# Patient Record
Sex: Male | Born: 2000 | Race: Black or African American | Hispanic: No | Marital: Single | Smoking: Current every day smoker
Health system: Southern US, Community
[De-identification: ages and names within clinical notes are randomized; demographics above are authoritative.]

## PROBLEM LIST (undated history)

## (undated) DIAGNOSIS — Z21 Asymptomatic human immunodeficiency virus [HIV] infection status: Secondary | ICD-10-CM

## (undated) DIAGNOSIS — B20 Human immunodeficiency virus [HIV] disease: Secondary | ICD-10-CM

---
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
## 2021-12-04 ENCOUNTER — Emergency Department (HOSPITAL_BASED_OUTPATIENT_CLINIC_OR_DEPARTMENT_OTHER): Payer: Medicaid Other

## 2021-12-04 ENCOUNTER — Emergency Department (HOSPITAL_BASED_OUTPATIENT_CLINIC_OR_DEPARTMENT_OTHER)
Admission: EM | Admit: 2021-12-04 | Discharge: 2021-12-04 | Disposition: A | Discharge status: Home / Self Care | Payer: Medicaid Other | Attending: Emergency Medicine | Admitting: Emergency Medicine

## 2021-12-04 ENCOUNTER — Encounter (HOSPITAL_BASED_OUTPATIENT_CLINIC_OR_DEPARTMENT_OTHER): Payer: Self-pay

## 2021-12-04 ENCOUNTER — Other Ambulatory Visit: Payer: Self-pay

## 2021-12-04 DIAGNOSIS — J3489 Other specified disorders of nose and nasal sinuses: Secondary | ICD-10-CM | POA: Insufficient documentation

## 2021-12-04 DIAGNOSIS — R0982 Postnasal drip: Secondary | ICD-10-CM | POA: Insufficient documentation

## 2021-12-04 DIAGNOSIS — R051 Acute cough: Secondary | ICD-10-CM | POA: Insufficient documentation

## 2021-12-04 DIAGNOSIS — Z21 Asymptomatic human immunodeficiency virus [HIV] infection status: Secondary | ICD-10-CM | POA: Insufficient documentation

## 2021-12-04 DIAGNOSIS — R109 Unspecified abdominal pain: Secondary | ICD-10-CM | POA: Diagnosis not present

## 2021-12-04 DIAGNOSIS — M791 Myalgia, unspecified site: Secondary | ICD-10-CM | POA: Diagnosis not present

## 2021-12-04 DIAGNOSIS — R197 Diarrhea, unspecified: Secondary | ICD-10-CM | POA: Diagnosis not present

## 2021-12-04 DIAGNOSIS — K5900 Constipation, unspecified: Secondary | ICD-10-CM | POA: Diagnosis not present

## 2021-12-04 DIAGNOSIS — Z20822 Contact with and (suspected) exposure to covid-19: Secondary | ICD-10-CM | POA: Diagnosis not present

## 2021-12-04 DIAGNOSIS — R112 Nausea with vomiting, unspecified: Secondary | ICD-10-CM | POA: Insufficient documentation

## 2021-12-04 HISTORY — DX: Asymptomatic human immunodeficiency virus (hiv) infection status: Z21

## 2021-12-04 HISTORY — DX: Human immunodeficiency virus (HIV) disease: B20

## 2021-12-04 LAB — CBC WITH DIFFERENTIAL/PLATELET
Abs Immature Granulocytes: 0.01 10*3/uL (ref 0.00–0.07)
Basophils Absolute: 0 10*3/uL (ref 0.0–0.1)
Basophils Relative: 0 %
Eosinophils Absolute: 0 10*3/uL (ref 0.0–0.5)
Eosinophils Relative: 1 %
HCT: 49.3 % (ref 39.0–52.0)
Hemoglobin: 17.2 g/dL — ABNORMAL HIGH (ref 13.0–17.0)
Immature Granulocytes: 0 %
Lymphocytes Relative: 27 %
Lymphs Abs: 1.4 10*3/uL (ref 0.7–4.0)
MCH: 33.1 pg (ref 26.0–34.0)
MCHC: 34.9 g/dL (ref 30.0–36.0)
MCV: 94.8 fL (ref 80.0–100.0)
Monocytes Absolute: 0.5 10*3/uL (ref 0.1–1.0)
Monocytes Relative: 9 %
Neutro Abs: 3.2 10*3/uL (ref 1.7–7.7)
Neutrophils Relative %: 63 %
Platelets: 268 10*3/uL (ref 150–400)
RBC: 5.2 MIL/uL (ref 4.22–5.81)
RDW: 12.8 % (ref 11.5–15.5)
WBC: 5.2 10*3/uL (ref 4.0–10.5)
nRBC: 0 % (ref 0.0–0.2)

## 2021-12-04 LAB — COMPREHENSIVE METABOLIC PANEL
ALT: 16 U/L (ref 0–44)
AST: 22 U/L (ref 15–41)
Albumin: 4.7 g/dL (ref 3.5–5.0)
Alkaline Phosphatase: 76 U/L (ref 38–126)
Anion gap: 10 (ref 5–15)
BUN: 10 mg/dL (ref 6–20)
CO2: 26 mmol/L (ref 22–32)
Calcium: 9.7 mg/dL (ref 8.9–10.3)
Chloride: 101 mmol/L (ref 98–111)
Creatinine, Ser: 1.07 mg/dL (ref 0.61–1.24)
GFR, Estimated: >60 mL/min (ref >60)
Glucose, Bld: 89 mg/dL (ref 70–99)
Potassium: 3.6 mmol/L (ref 3.5–5.1)
Sodium: 137 mmol/L (ref 135–145)
Total Bilirubin: 1.1 mg/dL (ref 0.3–1.2)
Total Protein: 8.9 g/dL — ABNORMAL HIGH (ref 6.5–8.1)

## 2021-12-04 LAB — RESP PANEL BY RT-PCR (FLU A&B, COVID) ARPGX2
Influenza A by PCR: NEGATIVE
Influenza B by PCR: NEGATIVE
SARS Coronavirus 2 by RT PCR: NEGATIVE

## 2021-12-04 LAB — LIPASE, BLOOD: Lipase: 32 U/L (ref 11–51)

## 2021-12-04 LAB — URINALYSIS, ROUTINE W REFLEX MICROSCOPIC
Bilirubin Urine: NEGATIVE
Glucose, UA: NEGATIVE mg/dL
Hgb urine dipstick: NEGATIVE
Ketones, ur: NEGATIVE mg/dL
Leukocytes,Ua: NEGATIVE
Nitrite: NEGATIVE
Protein, ur: NEGATIVE mg/dL
Specific Gravity, Urine: 1.025 (ref 1.005–1.030)
pH: 7 (ref 5.0–8.0)

## 2021-12-04 MED ORDER — ONDANSETRON HCL 4 MG PO TABS: 4.0000 mg | ORAL_TABLET | Freq: Four times a day (QID) | ORAL | 0 refills | Status: DC

## 2021-12-04 MED ORDER — DICYCLOMINE HCL 20 MG PO TABS: 20.0000 mg | ORAL_TABLET | Freq: Two times a day (BID) | ORAL | 0 refills | Status: DC

## 2021-12-04 MED ORDER — DICYCLOMINE HCL 10 MG/ML IM SOLN
20.0000 mg | Freq: Once | INTRAMUSCULAR | Status: AC
  Administered 2021-12-04: 20 mg via INTRAMUSCULAR
  Filled 2021-12-04: qty 2

## 2021-12-04 MED ORDER — IOHEXOL 300 MG/ML  SOLN
100.0000 mL | Freq: Once | INTRAMUSCULAR | Status: AC | PRN
  Administered 2021-12-04: 100 mL via INTRAVENOUS

## 2021-12-04 MED ORDER — ALBUTEROL SULFATE HFA 108 (90 BASE) MCG/ACT IN AERS
2.0000 | INHALATION_SPRAY | Freq: Once | RESPIRATORY_TRACT | Status: AC
  Administered 2021-12-04: 2 via RESPIRATORY_TRACT
  Filled 2021-12-04: qty 6.7

## 2021-12-04 MED ORDER — ONDANSETRON HCL 4 MG/2ML IJ SOLN
4.0000 mg | Freq: Once | INTRAMUSCULAR | Status: AC
  Administered 2021-12-04: 4 mg via INTRAVENOUS
  Filled 2021-12-04: qty 2

## 2021-12-04 MED ORDER — KETOROLAC TROMETHAMINE 15 MG/ML IJ SOLN
15.0000 mg | Freq: Once | INTRAMUSCULAR | Status: AC
  Administered 2021-12-04: 15 mg via INTRAVENOUS
  Filled 2021-12-04: qty 1

## 2021-12-04 MED ORDER — BENZONATATE 100 MG PO CAPS: 100.0000 mg | ORAL_CAPSULE | Freq: Three times a day (TID) | ORAL | 0 refills | Status: AC

## 2021-12-04 MED ORDER — SODIUM CHLORIDE 0.9 % IV BOLUS
1000.0000 mL | Freq: Once | INTRAVENOUS | Status: AC
  Administered 2021-12-04: 1000 mL via INTRAVENOUS

## 2021-12-04 NOTE — Discharge Instructions (Addendum)
Take zofran and bentyl for your abdominal symptoms.   Take tessalon for your cough  Please follow up with your primary doctor within the next 5-7 days.  If you do not have a primary care provider, information for a healthcare clinic has been provided for you to make arrangements for follow up care. Please return to the ER sooner if you have any new or worsening symptoms, or if you have any of the following symptoms:  Abdominal pain that does not go away.  You have a fever.  You keep throwing up (vomiting).  The pain is felt only in portions of the abdomen. Pain in the right side could possibly be appendicitis. In an adult, pain in the left lower portion of the abdomen could be colitis or diverticulitis.  You pass bloody or black tarry stools.  There is bright red blood in the stool.  The constipation stays for more than 4 days.  There is belly (abdominal) or rectal pain.  You do not seem to be getting better.  You have any questions or concerns.

## 2021-12-04 NOTE — ED Triage Notes (Signed)
Pt reports abdominal cramps, alternating constipation/diarrhea, decreased appetite.  Nausea, vomiting x1.  Denies fever.

## 2021-12-04 NOTE — ED Notes (Signed)
Ultrasound IV obtained after unsuccessful previous attempts. Pt tolerated procedure very well, calm and cooperative, 20g IV placed in Rt AC, CMS assessed post placement found to be WNL>

## 2021-12-04 NOTE — ED Provider Notes (Signed)
[facility] EMERGENCY DEPARTMENT Provider Note   CSN: [id number] Arrival date & time: 12/04/21  1127     History Chief Complaint  Patient presents with   Abdominal Pain    Adrian Little is a 20 y.o. male.  HPI   Pt is an 20 y/o male with a h/o HIV who presents to the ED today for eval of abd discomfort. He states he has had abd discomfort for the last few days. He had has had nausea and alternating diarrhea and constipation. He has also had vomiting. He further reports body aches, cough, rhinorrhea, post nasal drip.   Past Medical History:  Diagnosis Date   HIV (human immunodeficiency virus infection) (HCC)     There are no problems to display for this patient.   History reviewed. No pertinent surgical history.     History reviewed. No pertinent family history.     Home Medications Prior to Admission medications   Medication Sig Start Date End Date Taking? Authorizing Provider  benzonatate (TESSALON) 100 MG capsule Take 1 capsule (100 mg total) by mouth every 8 (eight) hours for 5 days. 12/04/21 12/09/21 Yes Naureen Benton S, PA-C  dicyclomine (BENTYL) 20 MG tablet Take 1 tablet (20 mg total) by mouth 2 (two) times daily. 12/04/21  Yes Buford Bremer S, PA-C  ondansetron (ZOFRAN) 4 MG tablet Take 1 tablet (4 mg total) by mouth every 6 (six) hours. 12/04/21  Yes Drake Wuertz S, PA-C    Allergies    Patient has no known allergies.  Review of Systems   Review of Systems  Constitutional:  Negative for fever.  HENT:  Positive for postnasal drip and rhinorrhea. Negative for ear pain and sore throat.   Eyes:  Negative for pain and visual disturbance.  Respiratory:  Positive for cough. Negative for shortness of breath.   Cardiovascular:  Negative for chest pain.  Gastrointestinal:  Positive for abdominal pain, constipation, diarrhea, nausea and vomiting.  Genitourinary:  Negative for dysuria and hematuria.  Musculoskeletal:  Positive for myalgias.  Negative for back pain.  Skin:  Negative for color change and rash.  Neurological:  Negative for seizures and syncope.  All other systems reviewed and are negative.  Physical Exam Updated Vital Signs BP 117/62   Pulse (!) 51   Temp 98.1 F (36.7 C) (Oral)   Resp 18   Ht 5\' 7"  (1.702 m)   Wt 81.6 kg   SpO2 100%   BMI 28.19 kg/m   Physical Exam Vitals and nursing note reviewed.  Constitutional:      General: He is not in acute distress.    Appearance: He is well-developed.  HENT:     Head: Normocephalic and atraumatic.  Eyes:     Conjunctiva/sclera: Conjunctivae normal.  Cardiovascular:     Rate and Rhythm: Normal rate and regular rhythm.     Heart sounds: No murmur heard. Pulmonary:     Effort: Pulmonary effort is normal. No respiratory distress.     Breath sounds: Normal breath sounds.  Abdominal:     Palpations: Abdomen is soft.     Tenderness: There is abdominal tenderness in the right lower quadrant, left upper quadrant and left lower quadrant. There is no guarding or rebound.  Musculoskeletal:        General: No swelling.     Cervical back: Neck supple.  Skin:    General: Skin is warm and dry.     Capillary Refill: Capillary refill takes less than 2 seconds.  Neurological:     Mental Status: He is alert.  Psychiatric:        Mood and Affect: Mood normal.    ED Results / Procedures / Treatments   Labs (all labs ordered are listed, but only abnormal results are displayed) Labs Reviewed  CBC WITH DIFFERENTIAL/PLATELET - Abnormal; Notable for the following components:      Result Value   Hemoglobin 17.2 (*)    All other components within normal limits  COMPREHENSIVE METABOLIC PANEL - Abnormal; Notable for the following components:   Total Protein 8.9 (*)    All other components within normal limits  RESP PANEL BY RT-PCR (FLU A&B, COVID) ARPGX2  LIPASE, BLOOD  URINALYSIS, ROUTINE W REFLEX MICROSCOPIC    EKG None  Radiology CT ABDOMEN PELVIS W  CONTRAST  Result Date: 12/04/2021 CLINICAL DATA:  RIGHT lower quadrant pain.  Abdominal cramps. EXAM: CT ABDOMEN AND PELVIS WITH CONTRAST TECHNIQUE: Multidetector CT imaging of the abdomen and pelvis was performed using the standard protocol following bolus administration of intravenous contrast. CONTRAST:  158mL OMNIPAQUE IOHEXOL 300 MG/ML  SOLN COMPARISON:  None. FINDINGS: Lower chest: Small focus of airspace density in the RIGHT middle lobe (image 10/3) Hepatobiliary: No focal hepatic lesion. No biliary duct dilatation. Common bile duct is normal. Pancreas: Pancreas is normal. No ductal dilatation. No pancreatic inflammation. Spleen: Normal spleen Adrenals/urinary tract: Adrenal glands and kidneys are normal. The ureters and bladder normal. Stomach/Bowel: Stomach, small bowel, appendix, and cecum are normal. The colon and rectosigmoid colon are normal. Vascular/Lymphatic: Abdominal aorta is normal caliber. No periportal or retroperitoneal adenopathy. No pelvic adenopathy. Reproductive: Unremarkable Other: No free fluid. Musculoskeletal: No aggressive osseous lesion. IMPRESSION: 1. Normal appendix. 2. No obstructive uropathy. 3. Normal gallbladder. Electronically Signed   By: Suzy Bouchard M.D.   On: 12/04/2021 15:46   DG Chest Portable 1 View  Result Date: 12/04/2021 CLINICAL DATA:  Cough EXAM: PORTABLE CHEST 1 VIEW COMPARISON:  None. FINDINGS: The heart size and mediastinal contours are within normal limits. Both lungs are clear. No pleural effusion. The visualized skeletal structures are unremarkable. IMPRESSION: No acute process in the chest. Electronically Signed   By: Macy Mis M.D.   On: 12/04/2021 13:44    Procedures Procedures   Medications Ordered in ED Medications  sodium chloride 0.9 % bolus 1,000 mL ( Intravenous Stopped 12/04/21 1558)  dicyclomine (BENTYL) injection 20 mg (20 mg Intramuscular Given 12/04/21 1448)  ondansetron (ZOFRAN) injection 4 mg (4 mg Intravenous Given  12/04/21 1357)  ketorolac (TORADOL) 15 MG/ML injection 15 mg (15 mg Intravenous Given 12/04/21 1539)  iohexol (OMNIPAQUE) 300 MG/ML solution 100 mL (100 mLs Intravenous Contrast Given 12/04/21 1527)    ED Course  I have reviewed the triage vital signs and the nursing notes.  Pertinent labs & imaging results that were available during my care of the patient were reviewed by me and considered in my medical decision making (see chart for details).    MDM Rules/Calculators/A&P                          20 year old male presents for evaluation of abdominal pain nausea vomiting diarrhea and URI symptoms.  He does have tenderness to the abdomen on exam.  His laboratory work is reassuring without leukocytosis, electrolyte abnormalities, abnormal kidney or liver function.  His COVID and flu test are negative.  His chest x-ray does not show pneumonia.  He CT abdomen/pelvis does not show any  emergent intra-abdominal/pelvic pathology at this time.  I suspect he likely has a viral illness.  He is unable to tolerate p.o. in the ED and feels some improvement after IV fluids, antiemetics, Bentyl and Toradol in the ED.  We will treat him supportively with Bentyl, Zofran and cough medication.  Have advised that he follow-up closely with PCP and return to the ED for any new or worsening symptoms.  He voices understanding of the plan and reasons to return.  All questions answered.  Patient stable for discharge.   Final Clinical Impression(s) / ED Diagnoses Final diagnoses:  Nausea vomiting and diarrhea  Acute cough    Rx / DC Orders ED Discharge Orders          Ordered    ondansetron (ZOFRAN) 4 MG tablet  Every 6 hours        12/04/21 1604    dicyclomine (BENTYL) 20 MG tablet  2 times daily        12/04/21 1604    benzonatate (TESSALON) 100 MG capsule  Every 8 hours        12/04/21 [address], Kariyah Baugh S, PA-C 12/04/21 1605    Gareth Morgan, MD 12/06/21 0006

## 2021-12-15 ENCOUNTER — Ambulatory Visit: Payer: Medicaid Other | Admitting: Pharmacist

## 2021-12-15 ENCOUNTER — Other Ambulatory Visit (HOSPITAL_COMMUNITY): Payer: Self-pay

## 2021-12-15 ENCOUNTER — Encounter: Payer: Self-pay | Admitting: Physician Assistant

## 2021-12-15 ENCOUNTER — Ambulatory Visit (INDEPENDENT_AMBULATORY_CARE_PROVIDER_SITE_OTHER): Payer: Medicaid Other | Admitting: Physician Assistant

## 2021-12-15 ENCOUNTER — Other Ambulatory Visit: Payer: Self-pay

## 2021-12-15 ENCOUNTER — Other Ambulatory Visit (HOSPITAL_COMMUNITY)
Admission: RE | Admit: 2021-12-15 | Discharge: 2021-12-15 | Disposition: A | Discharge status: Home / Self Care | Payer: Medicaid Other | Source: Ambulatory Visit | Attending: Physician Assistant | Admitting: Physician Assistant

## 2021-12-15 ENCOUNTER — Ambulatory Visit (INDEPENDENT_AMBULATORY_CARE_PROVIDER_SITE_OTHER): Payer: Medicaid Other

## 2021-12-15 VITALS — BP 119/76 | HR 60 | Temp 97.5°F | Wt 202.0 lb

## 2021-12-15 DIAGNOSIS — Z202 Contact with and (suspected) exposure to infections with a predominantly sexual mode of transmission: Secondary | ICD-10-CM | POA: Diagnosis not present

## 2021-12-15 DIAGNOSIS — Z23 Encounter for immunization: Secondary | ICD-10-CM | POA: Diagnosis present

## 2021-12-15 DIAGNOSIS — Z113 Encounter for screening for infections with a predominantly sexual mode of transmission: Secondary | ICD-10-CM

## 2021-12-15 DIAGNOSIS — B20 Human immunodeficiency virus [HIV] disease: Secondary | ICD-10-CM | POA: Diagnosis present

## 2021-12-15 MED ORDER — PENICILLIN G BENZATHINE 1200000 UNIT/2ML IM SUSY
1.2000 10*6.[IU] | PREFILLED_SYRINGE | Freq: Once | INTRAMUSCULAR | Status: AC
  Administered 2021-12-15: 11:00:00 1.2 10*6.[IU] via INTRAMUSCULAR

## 2021-12-15 MED ORDER — BIKTARVY 50-200-25 MG PO TABS: 1.0000 | ORAL_TABLET | Freq: Every day | ORAL | 5 refills | Status: DC

## 2021-12-15 MED ORDER — PENICILLIN G BENZATHINE 1200000 UNIT/2ML IM SUSY: 2.4000 10*6.[IU] | PREFILLED_SYRINGE | Freq: Once | INTRAMUSCULAR | Status: DC

## 2021-12-15 NOTE — Patient Instructions (Addendum)
Hello Adrian Little! Pleasure meeting you today.  Continue your Biktarvy daily.  We will set you up for a dental referral. We have provided bicillin today for your exposure to syphilis.  You will need to avoid sexual contact for 7-10 days. Make sure you either 1) don't have sex for a week or 2) use a condom with sex for at least a week. Condoms with sex is always recommended.  I will Mychart you the other lab results.  Today you received your final meningococcal vaccine, covid booster, flu vaccine, and bicillin 2.4 million units. I refilled Biktarvy and sent to pharmacy for 6 months Follow up in 3 months Continue to see your primary care provider for ongoing chest tightness, earlier if needed.

## 2021-12-15 NOTE — Addendum Note (Signed)
Addended by: Tressa Busman T on: 12/15/2021 10:46 AM   Modules accepted: Orders

## 2021-12-15 NOTE — Addendum Note (Signed)
Addended by: Tressa Busman T on: 12/15/2021 10:49 AM   Modules accepted: Orders

## 2021-12-15 NOTE — Progress Notes (Signed)
Initial Diagnosis: VL >2 million copies with nadir 292 CD4 diagnosed 04/2020 HLA [id number]: Negative Genotype: Could not access from records RPR: 1:64 08/2021 Lipid Profile:  Immunizations: Needs HPV, 2nd Meningococcal, flu, covid booster, pending Hep A antibody result  Prevnar -  Pneumovax -  Influenza -  Meningococcal -  Hepatitis B -     Subjective:    Patient ID: Adrian Little, male    DOB: 26-Oct-2001, 20 y.o.   MRN: [id number]  Chief Complaint  Patient presents with   New Patient (Initial Visit)    Transfer B20   Other    Transfer HIV patient from [facility] children's infectious Disease.      HPI:  Adrian Little is a 20 y.o. male AA, living with HIV-1 and is adherent to current ART regimen BIKTARVY. He has transferred his care from [facility]. He was diagnosed 5/202, most likely from sexual activity.  He is bisexual and currently in a monogamous relationship with a male partner who is also HIV-1 positive and seen in our clinic. His last viral load 09/14/21 9,544 copies and CD4 was 342, this was due to a recent move to [facility] when he was not adherent to his ART. He has not missed any doses since that time. He is living with partner in [place], [place].  He feels supported by his friends.  He has not disclosed HIV status to his younger sister or maternal grandmother he is closest with.  No relationship with father or 2 older brothers.  Mother is deceased. He is currently looking for work at [employer] in [place].  He does not have transportation and has been using [employer] or bus.  Originally from [place], [place] where he grew up in urban environment only.  He has no pets.  Has not been to dentist since young child.  He drinks twice weekly, does not binge does not enjoy being out of control.  Uses marijuana and no other illicit drugs. He hopes to attend college one day once he finds financial stability.  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 Partner currently being treated for syphilis, will need another round of bicillin. No other history of STI's.  He received covid series in Jan 2022 late 2021 per patient history. He would be ammenable to covid booster, flu vaccine, and 2nd Menvayo.     No Known Allergies    Outpatient Medications Prior to Visit  Medication Sig Dispense Refill   dicyclomine (BENTYL) 20 MG tablet Take 1 tablet (20 mg total) by mouth 2 (two) times daily. 20 tablet 0   ondansetron (ZOFRAN) 4 MG tablet Take 1 tablet (4 mg total) by mouth every 6 (six) hours. 12 tablet 0   bictegravir-emtricitabine-tenofovir AF (BIKTARVY) 50-200-25 MG TABS tablet Take 1 tablet by mouth daily.     No facility-administered medications prior to visit.     Past Medical History:  Diagnosis Date   HIV (human immunodeficiency virus infection) ([place])      History reviewed. No pertinent surgical history.     Review of Systems  Constitutional:  Negative for activity change, appetite change, chills, diaphoresis, fatigue and fever.  HENT:  Negative for mouth sores and sore throat.   Respiratory:  Positive for chest tightness (evaluated by primary care provider treated with mobic 11/2021). Negative for apnea, cough, shortness of breath and wheezing.   Cardiovascular:  Negative for chest pain, palpitations and leg swelling.  Gastrointestinal:  Negative for abdominal pain, anal bleeding, blood in stool, constipation, diarrhea, nausea, rectal pain and vomiting.  Genitourinary:  Negative for decreased urine volume, genital sores, hematuria, penile discharge, penile pain, penile swelling, scrotal swelling, testicular pain and urgency.  Musculoskeletal:  Negative for back pain and myalgias.  Skin:  Negative for rash and wound.  Neurological:  Negative for dizziness, tremors, seizures, weakness, light-headedness, numbness and headaches.  Hematological:  Negative for adenopathy.  Psychiatric/Behavioral:   Negative for agitation, behavioral problems, confusion, decreased concentration, dysphoric mood, hallucinations, self-injury, sleep disturbance and suicidal ideas. The patient is not nervous/anxious and is not hyperactive.      Objective:    There were no vitals taken for this visit. Nursing note and vital signs reviewed.  Physical Exam Vitals reviewed.  Constitutional:      General: He is not in acute distress.    Appearance: Normal appearance. He is normal weight. He is not ill-appearing.  HENT:     Head: Normocephalic and atraumatic.     Mouth/Throat:     Mouth: Mucous membranes are moist.     Pharynx: No oropharyngeal exudate or posterior oropharyngeal erythema.  Eyes:     Extraocular Movements: Extraocular movements intact.     Conjunctiva/sclera: Conjunctivae normal.     Pupils: Pupils are equal, round, and reactive to light.  Cardiovascular:     Rate and Rhythm: Normal rate and regular rhythm.  Pulmonary:     Effort: Pulmonary effort is normal.     Breath sounds: Normal breath sounds.  Musculoskeletal:        General: Normal range of motion.     Cervical back: Normal range of motion and neck supple. No tenderness.  Lymphadenopathy:     Cervical: No cervical adenopathy.  Skin:    General: Skin is warm and dry.     Findings: No lesion or rash.  Neurological:     General: No focal deficit present.     Mental Status: He is alert and oriented to person, place, and time.  Psychiatric:        Mood and Affect: Mood normal.        Behavior: Behavior normal.        Thought Content: Thought content normal.        Judgment: Judgment normal.     Depression screen PHQ 2/9 12/15/2021  Decreased Interest 0  Down, Depressed, Hopeless 0  PHQ - 2 Score 0       Assessment & Plan:  Reviewed medical records provided by [facility] children's infectious Disease Reviewed PCP CPE 12/08/2021 follow up scheduled 02/2022 Immunization-2nd meninogococcal vaccine, flu, covid booster-needs HPV  series STI screening-exposure to syphilis from partner-received Bicillin 2.4 million units today, received bicillin 2.4 million units x 3 this summer for a previous syphilis infection, screened for GC/chlamydia all sites today. Adivised to have no sexual contact for 10 days.  Dental referral today HIV-adherent to [facility] since last visit 08/2021-check VL, Cd4, no access to genotype, HLA B 5701, Hepatitis A ab. Refilled Biktarvy for 6 months will have $4 copay with Medicaid. Condoms provided along with lubricant.   Next visit needs HPV series, pneumococcal series, possible Hep A  Patient Active Problem List   Diagnosis Date Noted   HIV (human immunodeficiency virus infection) ([place]) 05/18/2020     Problem List Items Addressed This Visit       Other   HIV (human immunodeficiency virus infection) ([place]) - Primary   Relevant Medications   bictegravir-emtricitabine-tenofovir AF (  BIKTARVY) 50-200-25 MG TABS tablet   Other Relevant Orders   RPR   Other Visit Diagnoses     Venereal disease screening       Relevant Orders   HIV-1 RNA ultraquant reflex to gentyp+   T-helper cell (CD4)- ([facility] clinic only)   HLA B*5701   Hepatitis A antibody, total   Urine cytology ancillary only   Cytology (oral, anal, urethral) ancillary only   Cytology (oral, anal, urethral) ancillary only   Exposure to syphilis       Relevant Medications   penicillin g benzathine (BICILLIN LA) 1200000 UNIT/2ML injection 2.4 Million Units        I am having [name] maintain his ondansetron, dicyclomine, and Biktarvy. We will continue to administer penicillin g benzathine.   Meds ordered this encounter  Medications   bictegravir-emtricitabine-tenofovir AF (BIKTARVY) 50-200-25 MG TABS tablet    Sig: Take 1 tablet by mouth daily.    Dispense:  30 tablet    Refill:  5    Order Specific Question:   Supervising Provider    Answer:   VAN DAM, CORNELIUS N [[id number]]   penicillin g benzathine (BICILLIN LA)  1200000 UNIT/2ML injection 2.4 Million Units    Order Specific Question:   Antibiotic Indication:    Answer:   Syphilis     Follow-up: Return in about 3 months (around 03/15/2022) for  follow up.

## 2021-12-15 NOTE — Progress Notes (Signed)
° °  Covid-19 Vaccination Clinic  Name:  Adrian Little    MRN: [id number] DOB: [birth year]  12/15/2021  Adrian Little was observed post Covid-19 immunization for 15 minutes without incident. He was provided with Vaccine Information Sheet and instruction to access the V-Safe system.   Adrian Little was instructed to call 911 with any severe reactions post vaccine: Difficulty breathing  Swelling of face and throat  A fast heartbeat  A bad rash all over body  Dizziness and weakness   Immunizations Administered     Name Date Dose VIS Date Route   Pfizer Covid-19 Vaccine Bivalent Booster 12/15/2021 11:22 AM 0.3 mL 08/25/2021 Intramuscular   Manufacturer: [employer], [employer]   Lot: [id number]   NDC: [phone]      Hancel Ion T [name]

## 2021-12-16 LAB — CYTOLOGY, (ORAL, ANAL, URETHRAL) ANCILLARY ONLY
Chlamydia: NEGATIVE
Chlamydia: NEGATIVE
Comment: NEGATIVE
Comment: NEGATIVE
Comment: NORMAL
Comment: NORMAL
Neisseria Gonorrhea: NEGATIVE
Neisseria Gonorrhea: NEGATIVE

## 2021-12-16 LAB — URINE CYTOLOGY ANCILLARY ONLY
Chlamydia: NEGATIVE
Comment: NEGATIVE
Comment: NORMAL
Neisseria Gonorrhea: NEGATIVE

## 2021-12-17 LAB — T-HELPER CELL (CD4) - (RCID CLINIC ONLY)
CD4 % Helper T Cell: 34 % (ref 33–65)
CD4 T Cell Abs: 404 /uL (ref 400–1790)

## 2021-12-21 ENCOUNTER — Telehealth: Payer: Self-pay

## 2021-12-21 NOTE — Telephone Encounter (Signed)
Called patient to relay results, no answer and unable to leave voicemail.   Flordia Kassem D Chayse Gracey, RN  

## 2021-12-21 NOTE — Telephone Encounter (Signed)
-----   Message from [name], [place] sent at 12/21/2021 10:55 AM EST ----- Please notify Adrian Little that his syphilis was reactive, will recheck at next visit in March to ensure appropriate response to bicillin provided at clinic visit, titer was not elevated but mostly likely due to recent exposure. Gonorrhea and chlamydia was negative from all 3 sites. He is immune to Hepatitis A most likely from childhood immunization. CD4 count improved to 404 and Viral load greatly improved to 78.  Continue taking Biktarvy.

## 2021-12-21 NOTE — Progress Notes (Signed)
Please notify Adrian Little that his syphilis was reactive, will recheck at next visit in March to ensure appropriate response to bicillin provided at clinic visit, titer was not elevated but mostly likely due to recent exposure. Gonorrhea and chlamydia was negative from all 3 sites. He is immune to Hepatitis A most likely from childhood immunization. CD4 count improved to 404 and Viral load greatly improved to 78.  Continue taking Biktarvy.

## 2021-12-22 LAB — RPR TITER: RPR Titer: 1:8 {titer} — ABNORMAL HIGH

## 2021-12-22 LAB — FLUORESCENT TREPONEMAL AB(FTA)-IGG-BLD: Fluorescent Treponemal ABS: REACTIVE — AB

## 2021-12-22 LAB — HIV-1 RNA ULTRAQUANT REFLEX TO GENTYP+
HIV 1 RNA Quant: 78 copies/mL — ABNORMAL HIGH
HIV-1 RNA Quant, Log: 1.89 Log copies/mL — ABNORMAL HIGH

## 2021-12-22 LAB — RPR: RPR Ser Ql: REACTIVE — AB

## 2021-12-22 LAB — HEPATITIS A ANTIBODY, TOTAL: Hepatitis A AB,Total: REACTIVE — AB

## 2021-12-22 LAB — HLA B*5701: HLA-B*5701 w/rflx HLA-B High: NEGATIVE

## 2021-12-22 NOTE — Telephone Encounter (Signed)
I spoke to the patient and relayed lab results. Patient verbalized understanding and had no questions. Adrian Little T [name]

## 2021-12-23 ENCOUNTER — Other Ambulatory Visit: Payer: Self-pay

## 2021-12-23 ENCOUNTER — Ambulatory Visit (INDEPENDENT_AMBULATORY_CARE_PROVIDER_SITE_OTHER): Payer: Medicaid Other

## 2021-12-23 DIAGNOSIS — Z23 Encounter for immunization: Secondary | ICD-10-CM | POA: Diagnosis not present

## 2021-12-29 ENCOUNTER — Encounter: Payer: Self-pay | Admitting: Physician Assistant

## 2022-03-09 ENCOUNTER — Ambulatory Visit: Payer: Medicaid Other | Admitting: Physician Assistant

## 2022-12-28 ENCOUNTER — Other Ambulatory Visit: Payer: Self-pay

## 2022-12-28 ENCOUNTER — Ambulatory Visit (INDEPENDENT_AMBULATORY_CARE_PROVIDER_SITE_OTHER): Payer: Medicaid Other | Admitting: Physician Assistant

## 2022-12-28 ENCOUNTER — Encounter: Payer: Self-pay | Admitting: Physician Assistant

## 2022-12-28 ENCOUNTER — Ambulatory Visit (INDEPENDENT_AMBULATORY_CARE_PROVIDER_SITE_OTHER): Payer: Medicaid Other

## 2022-12-28 VITALS — BP 107/69 | HR 65 | Temp 97.7°F | Ht 67.0 in | Wt 170.0 lb

## 2022-12-28 DIAGNOSIS — Z202 Contact with and (suspected) exposure to infections with a predominantly sexual mode of transmission: Secondary | ICD-10-CM | POA: Diagnosis not present

## 2022-12-28 DIAGNOSIS — B20 Human immunodeficiency virus [HIV] disease: Secondary | ICD-10-CM

## 2022-12-28 DIAGNOSIS — Z23 Encounter for immunization: Secondary | ICD-10-CM

## 2022-12-28 MED ORDER — BIKTARVY 50-200-25 MG PO TABS: 1.0000 | ORAL_TABLET | Freq: Every day | ORAL | 5 refills | Status: DC

## 2022-12-28 NOTE — Progress Notes (Signed)
Subjective:    Patient ID: Adrian Little, male    DOB: Mar 18, 2001, 22 y.o.   MRN: [id number]  Chief Complaint  Patient presents with   Follow-up    Out of [place] for a few months     HPI:  Adrian Little is a 22 y.o. male who is HIV-1 positive.  Last seen in clinic 12/15/2021 at that time he was taking Biktarvy daily VL was 78 and CD4 was 404. He has been without Biktarvy 4-5 months due to life stressors.  He lost his job and was "burried" by bills.  He denies any recent illnesses or symptoms. He feels he is in a "better place" currently with long term male partner, living together and working at [employer].  He is living in [place], sexually active with male partner, both monogamous, declines STI screening.   He has no pets, uses THC daily, alcohol socially only, no tobacco use. Overall mood is stable and has positive outlook on life.  Support from family is not present, but has good core of friends locally. He exercises at [employer].  PCP is Adrian Little. Last dental care > 10 years ago. He has [employer].  Requesting flu, covid and HPV vaccines today.     No Known Allergies    Outpatient Medications Prior to Visit  Medication Sig Dispense Refill   dicyclomine (BENTYL) 20 MG tablet Take 1 tablet (20 mg total) by mouth 2 (two) times daily. (Patient not taking: Reported on 12/28/2022) 20 tablet 0   ondansetron (ZOFRAN) 4 MG tablet Take 1 tablet (4 mg total) by mouth every 6 (six) hours. (Patient not taking: Reported on 12/28/2022) 12 tablet 0   bictegravir-emtricitabine-tenofovir AF (BIKTARVY) 50-200-25 MG TABS tablet Take 1 tablet by mouth daily. (Patient not taking: Reported on 12/28/2022) 30 tablet 5   No facility-administered medications prior to visit.     Past Medical History:  Diagnosis Date   HIV (human immunodeficiency virus infection) ([place])      History reviewed. No pertinent surgical history.     Review of Systems  Constitutional:   Negative for activity change, appetite change, chills, fatigue and fever.  HENT:  Negative for postnasal drip, rhinorrhea, sinus pressure, sinus pain, sneezing and sore throat.   Respiratory:  Negative for cough, shortness of breath and wheezing.   Cardiovascular:  Negative for chest pain, palpitations and leg swelling.  Gastrointestinal:  Negative for abdominal pain, constipation, diarrhea, nausea, rectal pain and vomiting.  Genitourinary: Negative.   Musculoskeletal:  Negative for arthralgias, myalgias and neck stiffness.  Skin:  Negative for rash.  Allergic/Immunologic: Positive for immunocompromised state.  Neurological:  Negative for dizziness, weakness and headaches.  Hematological:  Negative for adenopathy.  Psychiatric/Behavioral: Negative.        Objective:    BP 107/69   Pulse 65   Temp 97.7 F (36.5 C) (Oral)   Ht 5\' 7"  (1.702 m)   Wt 170 lb (77.1 kg)   SpO2 100%   BMI 26.63 kg/m  Nursing note and vital signs reviewed.  Physical Exam Vitals reviewed.  Constitutional:      General: He is not in acute distress.    Appearance: Normal appearance. He is normal weight. He is not ill-appearing, toxic-appearing or diaphoretic.  HENT:     Head: Normocephalic and atraumatic.  Eyes:     Extraocular Movements: Extraocular movements intact.     Conjunctiva/sclera: Conjunctivae normal.     Pupils: Pupils are equal, round, and reactive to  light.  Cardiovascular:     Rate and Rhythm: Normal rate and regular rhythm.     Pulses: Normal pulses.     Heart sounds: Normal heart sounds.  Pulmonary:     Effort: Pulmonary effort is normal.     Breath sounds: Normal breath sounds.  Skin:    General: Skin is warm and dry.  Neurological:     General: No focal deficit present.     Mental Status: He is alert and oriented to person, place, and time.  Psychiatric:        Mood and Affect: Mood normal.        Behavior: Behavior normal.        Thought Content: Thought content normal.         Judgment: Judgment normal.         12/28/2022   10:01 AM 12/15/2021    9:00 AM  Depression screen PHQ 2/9  Decreased Interest 0 0  Down, Depressed, Hopeless 0 0  PHQ - 2 Score 0 0       Assessment & Plan:  HIV-1: Lab work today. Adrian Little has been without Biktarvy x 5 months due to life stressors which have ironed themselves out and is in a "better place" at present.  Will start patient on Biktarvy 1 tablet once daily with or without food. Discussed importance of medication adherence and counseled patient on potential side-effects and that if he wishes or needs to take multivitamins in the future, he needs to separate Biktarvy 2 hours before taking them or 6 hours after.    Biktarvy Dosing Adjustments CrCl < 30 mL/minute: Use is not recommended. ESRD on HD: One tablet once daily; administer after hemodialysis on dialysis days Severe impairment (Child-Pugh class C): Use is not recommended ADE: fatigue, nausea, headache HPV, flu and covid provided today Dental referral today F/u in 6 weeks   Patient Active Problem List   Diagnosis Date Noted   HIV (human immunodeficiency virus infection) ([place]) 05/18/2020     Problem List Items Addressed This Visit       Other   HIV (human immunodeficiency virus infection) ([place])   Relevant Medications   bictegravir-emtricitabine-tenofovir AF (BIKTARVY) 50-200-25 MG TABS tablet   Other Relevant Orders   RPR   CBC with Differential/Platelet   COMPLETE METABOLIC PANEL WITH GFR   HIV-1 RNA quant-no reflex-bld   T-helper cells (CD4) count (not at [facility])   Hepatitis C antibody   HIV RNA, RTPCR W/R GT (RTI, PI,INT)   HPV 9-valent vaccine,Recombinat (Completed)   Flu Vaccine QUAD 24mo+IM (Fluarix, Fluzone & Alfiuria Quad PF) (Completed)   Other Visit Diagnoses     Exposure to syphilis    -  Primary   Relevant Orders   RPR   Need for HPV vaccine       Relevant Orders   HPV 9-valent vaccine,Recombinat (Completed)   Encounter for  immunization       Relevant Orders   Flu Vaccine QUAD 47mo+IM (Fluarix, Fluzone & Alfiuria Quad PF) (Completed)        I am having Adrian Little maintain his ondansetron, dicyclomine, and Biktarvy.   Meds ordered this encounter  Medications   bictegravir-emtricitabine-tenofovir AF (BIKTARVY) 50-200-25 MG TABS tablet    Sig: Take 1 tablet by mouth daily.    Dispense:  30 tablet    Refill:  5    Order Specific Question:   Supervising Provider    Answer:   VAN DAM, [place] [[id number]]  Follow-up: Return in about 6 weeks (around 02/08/2023) for HIV-1 care.

## 2022-12-28 NOTE — Patient Instructions (Addendum)
Flu, Covid, HPV vaccine today Refilled biktarvy take once daily, with or without food. May experience side effects like fatigue, nausea or diarrhea Dental referral placed today for [facility]. Information to schedule appointment completed today.  Follow up in 6 weeks

## 2022-12-29 LAB — T-HELPER CELLS (CD4) COUNT (NOT AT ARMC)
CD4 % Helper T Cell: 35 % (ref 33–65)
CD4 T Cell Abs: 377 /uL — ABNORMAL LOW (ref 400–1790)

## 2023-01-06 LAB — CBC WITH DIFFERENTIAL/PLATELET
Absolute Monocytes: 363 cells/uL (ref 200–950)
Basophils Absolute: 10 cells/uL (ref 0–200)
Basophils Relative: 0.3 %
Eosinophils Absolute: 30 cells/uL (ref 15–500)
Eosinophils Relative: 0.9 %
HCT: 42 % (ref 38.5–50.0)
Hemoglobin: 14.3 g/dL (ref 13.2–17.1)
Lymphs Abs: 1148 cells/uL (ref 850–3900)
MCH: 32.6 pg (ref 27.0–33.0)
MCHC: 34 g/dL (ref 32.0–36.0)
MCV: 95.9 fL (ref 80.0–100.0)
MPV: 11.7 fL (ref 7.5–12.5)
Monocytes Relative: 11 %
Neutro Abs: 1749 cells/uL (ref 1500–7800)
Neutrophils Relative %: 53 %
Platelets: 229 10*3/uL (ref 140–400)
RBC: 4.38 10*6/uL (ref 4.20–5.80)
RDW: 11.1 % (ref 11.0–15.0)
Total Lymphocyte: 34.8 %
WBC: 3.3 10*3/uL — ABNORMAL LOW (ref 3.8–10.8)

## 2023-01-06 LAB — COMPLETE METABOLIC PANEL WITH GFR
AG Ratio: 1.4 (calc) (ref 1.0–2.5)
ALT: 31 U/L (ref 9–46)
AST: 31 U/L (ref 10–40)
Albumin: 4.3 g/dL (ref 3.6–5.1)
Alkaline phosphatase (APISO): 50 U/L (ref 36–130)
BUN: 11 mg/dL (ref 7–25)
CO2: 27 mmol/L (ref 20–32)
Calcium: 9.2 mg/dL (ref 8.6–10.3)
Chloride: 106 mmol/L (ref 98–110)
Creat: 0.9 mg/dL (ref 0.60–1.24)
Globulin: 3 g/dL (calc) (ref 1.9–3.7)
Glucose, Bld: 87 mg/dL (ref 65–99)
Potassium: 4.5 mmol/L (ref 3.5–5.3)
Sodium: 138 mmol/L (ref 135–146)
Total Bilirubin: 0.7 mg/dL (ref 0.2–1.2)
Total Protein: 7.3 g/dL (ref 6.1–8.1)
eGFR: 125 mL/min/{1.73_m2} (ref >=60)

## 2023-01-06 LAB — T PALLIDUM AB: T Pallidum Abs: POSITIVE — AB

## 2023-01-06 LAB — HEPATITIS C ANTIBODY: Hepatitis C Ab: NONREACTIVE

## 2023-01-06 LAB — HIV-1 INTEGRASE GENOTYPE

## 2023-01-06 LAB — HIV RNA, RTPCR W/R GT (RTI, PI,INT)
HIV 1 RNA Quant: 3410 copies/mL — ABNORMAL HIGH
HIV-1 RNA Quant, Log: 3.53 Log copies/mL — ABNORMAL HIGH

## 2023-01-06 LAB — HIV-1 GENOTYPE: HIV-1 Genotype: DETECTED — AB

## 2023-01-06 LAB — RPR: RPR Ser Ql: REACTIVE — AB

## 2023-01-06 LAB — RPR TITER: RPR Titer: 1:2 {titer} — ABNORMAL HIGH

## 2023-01-06 IMAGING — CT CT ABD-PELV W/ CM
2 of 5 series · 17 of 46 positions shown, 19 images · IV contrast (Omnipaque)
Comparison: None.

CLINICAL DATA: RIGHT lower quadrant pain.  Abdominal cramps.

EXAM:
CT ABDOMEN AND PELVIS WITH CONTRAST
TECHNIQUE: Multidetector CT imaging of the abdomen and pelvis was performed
using the standard protocol following bolus administration of
intravenous contrast.
CONTRAST:  100mL OMNIPAQUE IOHEXOL 300 MG/ML  SOLN

[Series 2: axial st · axial · 0.88mm/px · z∈[-409,+11]mm · 14 of 97 slices shown, 16 images]
[im 7/97  soft-tissue]
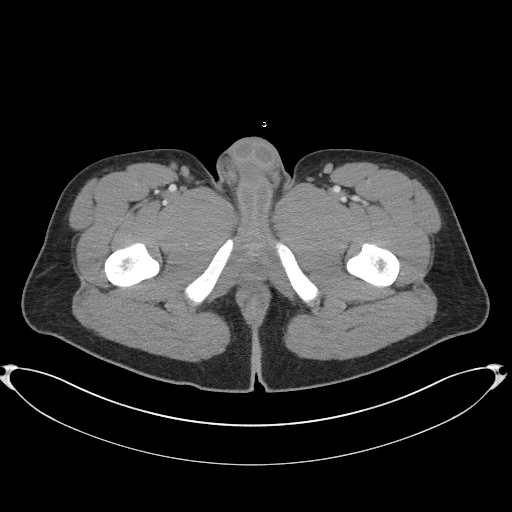
[im 7/97  bone]
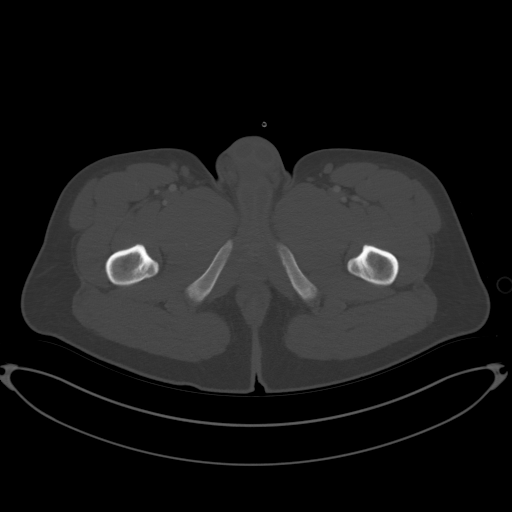
[im 13/97  soft-tissue]
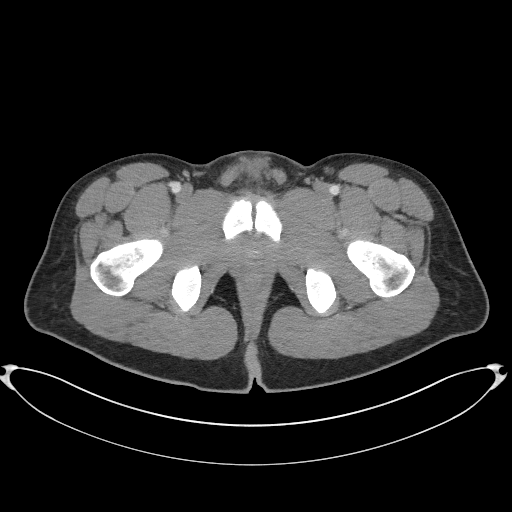
[im 19/97  soft-tissue]
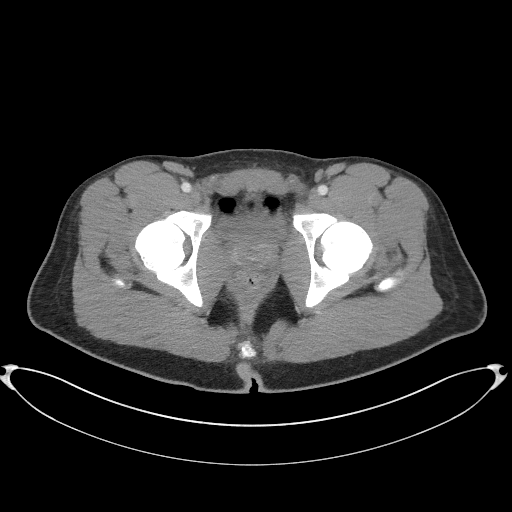
[im 25/97  soft-tissue]
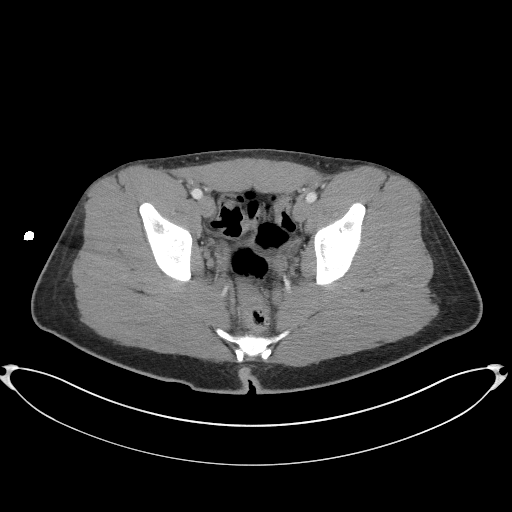
[im 31/97  soft-tissue]
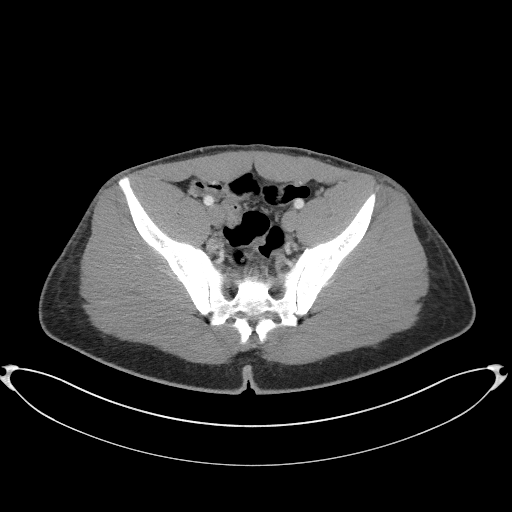
[im 37/97  soft-tissue]
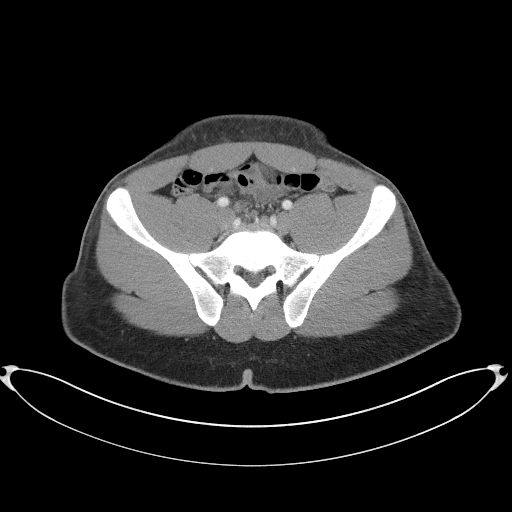
[im 43/97  soft-tissue]
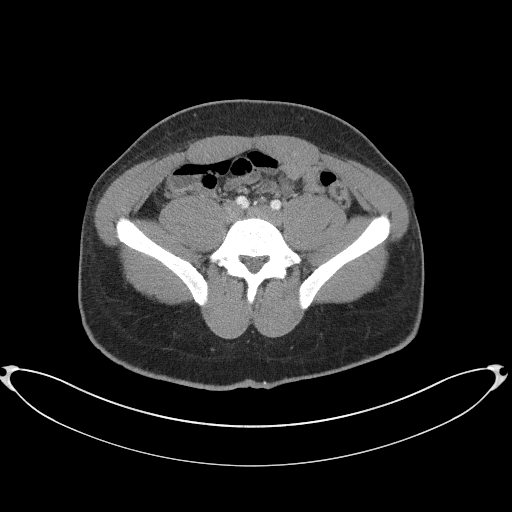
[im 55/97  soft-tissue]
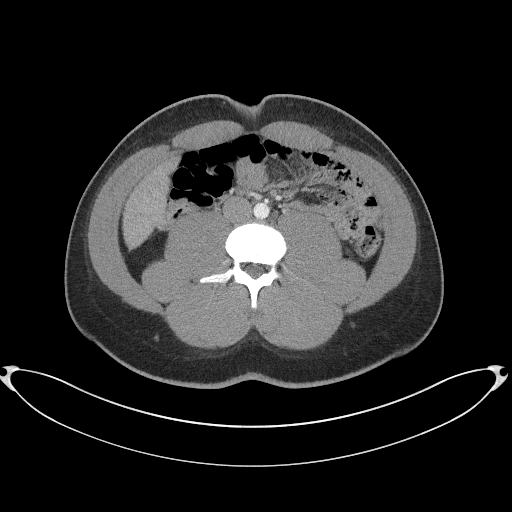
[im 61/97  soft-tissue]
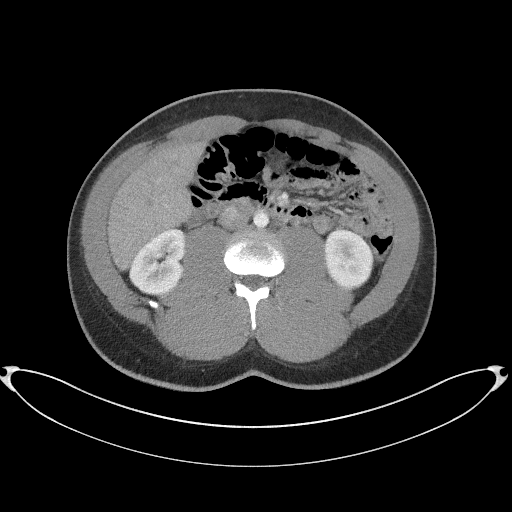
[im 61/97  bone]
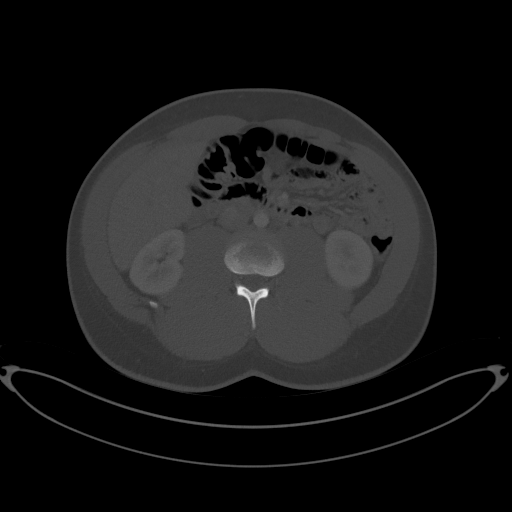
[im 67/97  soft-tissue]
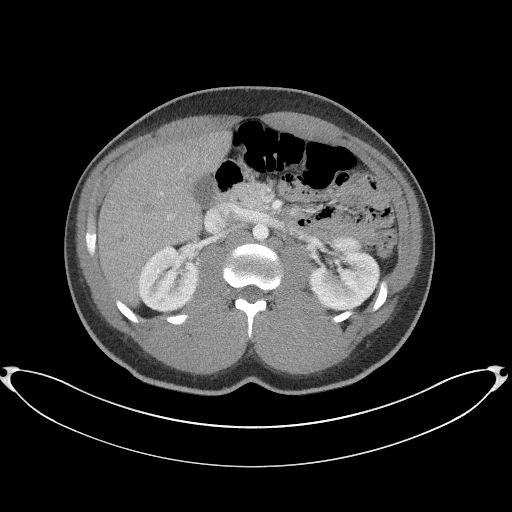
[im 73/97  soft-tissue]
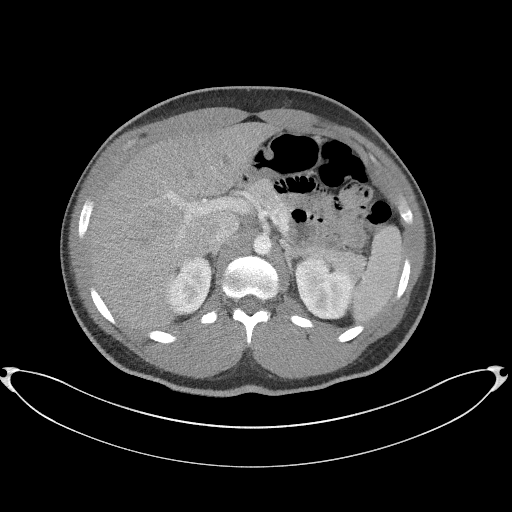
[im 79/97  soft-tissue]
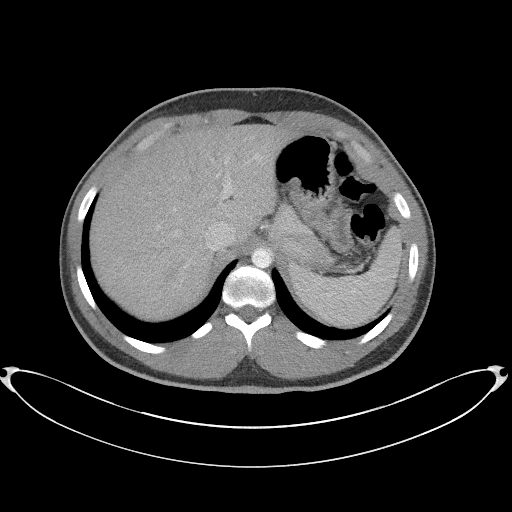
[im 85/97  soft-tissue]
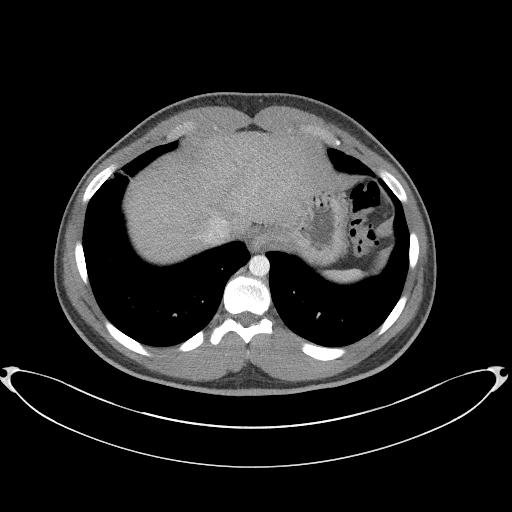
[im 91/97  soft-tissue]
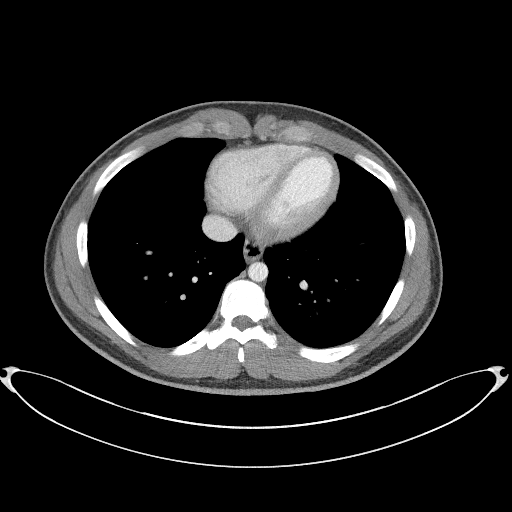

[Series 5: coronal st · coronal · 0.82mm/px · 3 of 111 slices shown]
[im 37/111  soft-tissue]
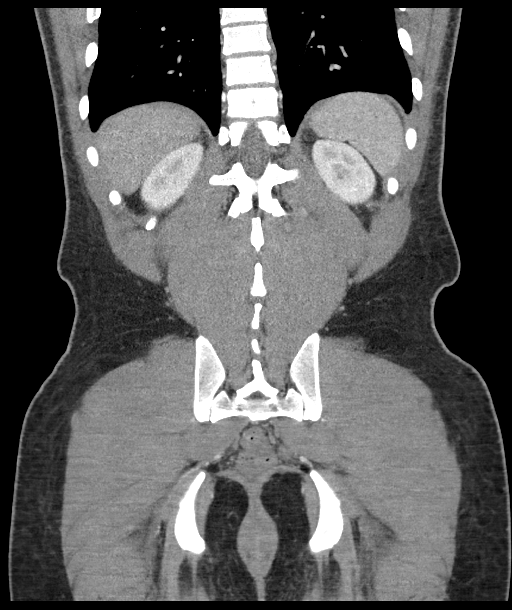
[im 49/111  soft-tissue]
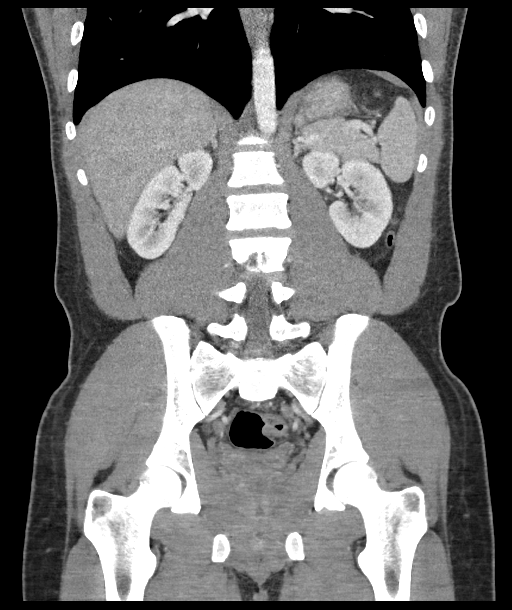
[im 62/111  soft-tissue]
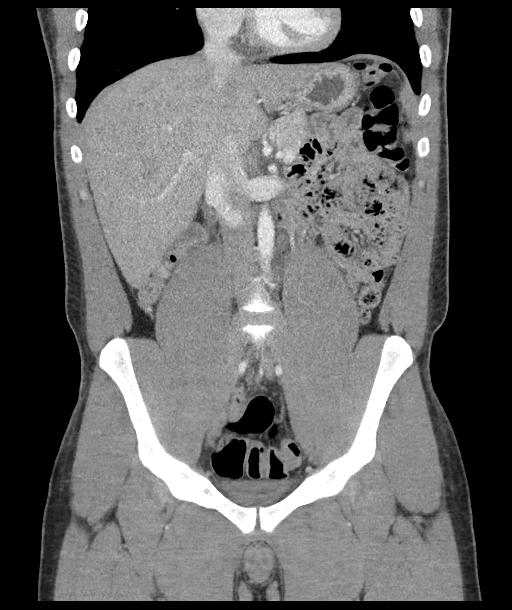

[17 of 46 positions shown; findings below may reference images not displayed]

FINDINGS: Lower chest: Small focus of airspace density in the RIGHT middle
lobe (image [DATE])

Hepatobiliary: No focal hepatic lesion. No biliary duct dilatation.
Common bile duct is normal.

Pancreas: Pancreas is normal. No ductal dilatation. No pancreatic
inflammation.

Spleen: Normal spleen

Adrenals/urinary tract: Adrenal glands and kidneys are normal. The
ureters and bladder normal.

Stomach/Bowel: Stomach, small bowel, appendix, and cecum are normal.
The colon and rectosigmoid colon are normal.

Vascular/Lymphatic: Abdominal aorta is normal caliber. No periportal
or retroperitoneal adenopathy. No pelvic adenopathy.

Reproductive: Unremarkable

Other: No free fluid.

Musculoskeletal: No aggressive osseous lesion.
IMPRESSION: 1. Normal appendix.
2. No obstructive uropathy.
3. Normal gallbladder.

## 2023-01-06 IMAGING — DX DG CHEST 1V PORT
1 series · 1 of 1 positions shown · non-contrast
Comparison: None.

CLINICAL DATA: Cough

EXAM:
PORTABLE CHEST 1 VIEW

[chest ap]
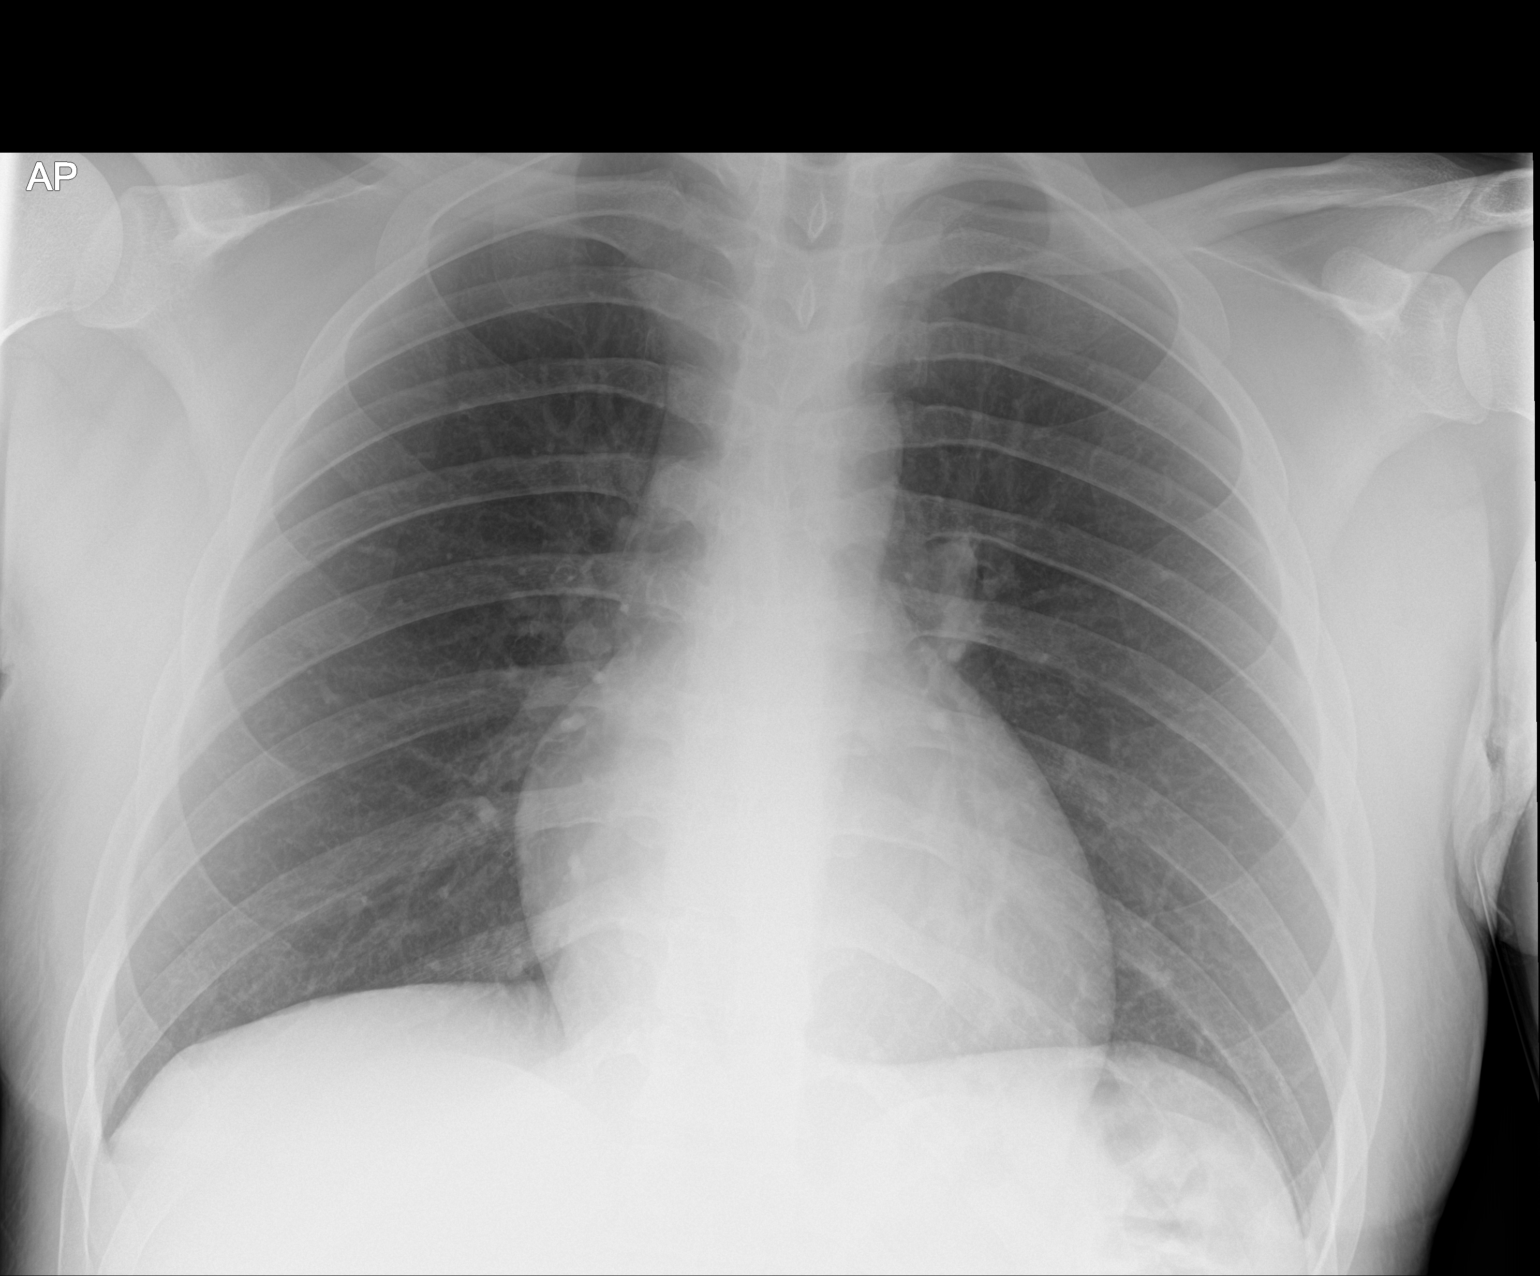

[1 of 1 positions shown; findings below may reference images not displayed]

FINDINGS: The heart size and mediastinal contours are within normal limits.
Both lungs are clear. No pleural effusion. The visualized skeletal
structures are unremarkable.
IMPRESSION: No acute process in the chest.

## 2023-02-08 ENCOUNTER — Other Ambulatory Visit: Payer: Self-pay

## 2023-02-08 ENCOUNTER — Ambulatory Visit (INDEPENDENT_AMBULATORY_CARE_PROVIDER_SITE_OTHER): Payer: Medicaid Other | Admitting: Physician Assistant

## 2023-02-08 ENCOUNTER — Encounter: Payer: Self-pay | Admitting: Physician Assistant

## 2023-02-08 ENCOUNTER — Ambulatory Visit
Admission: RE | Admit: 2023-02-08 | Discharge: 2023-02-08 | Disposition: A | Discharge status: Home / Self Care | Payer: Medicaid Other | Source: Ambulatory Visit | Attending: Physician Assistant | Admitting: Physician Assistant

## 2023-02-08 VITALS — BP 118/77 | HR 53 | Temp 98.3°F | Wt 167.8 lb

## 2023-02-08 DIAGNOSIS — K5904 Chronic idiopathic constipation: Secondary | ICD-10-CM | POA: Diagnosis present

## 2023-02-08 DIAGNOSIS — Z21 Asymptomatic human immunodeficiency virus [HIV] infection status: Secondary | ICD-10-CM | POA: Diagnosis not present

## 2023-02-08 NOTE — Progress Notes (Signed)
Subjective:    Patient ID: Adrian Little, male    DOB: 05-Aug-2001, 22 y.o.   MRN: [id number]  Chief Complaint  Patient presents with   Constipation    Follow up HIV     HPI:  Adrian Little is a 22 y.o. male with HIV-1 6 week follow up after re-starting Biktarvy. 12/28/22 VL 3,410 and CD4 377. He has been taking Biktarvy daily, without any missed doses and tolerating well. Kidney, liver function and blood counts were all within normal limits.   Pt has been experiencing some nausea and constipation chronically for the past 2 years. Has noticed more frequent episodes of constipation that leads to nausea and an episode of vomiting.  This has occurred 4 times in last 3 weeks. His last complete bowel movement occurred following saline enema 4-5 days ago. Urgency for BM daily, with minimal stool, hard round pellets and some liquid.  He denies abdominal pain, fever, chills, blood in stool, history of hemorrhoids, rectal pain. He admits he does not drink but 48 oz of water daily. No OTC stool softners used or fiber in diet. Vaping helps stimulate a BM per patient.    No Known Allergies    Outpatient Medications Prior to Visit  Medication Sig Dispense Refill   bictegravir-emtricitabine-tenofovir AF (BIKTARVY) 50-200-25 MG TABS tablet Take 1 tablet by mouth daily. 30 tablet 5   dicyclomine (BENTYL) 20 MG tablet Take 1 tablet (20 mg total) by mouth 2 (two) times daily. (Patient not taking: Reported on 12/28/2022) 20 tablet 0   ondansetron (ZOFRAN) 4 MG tablet Take 1 tablet (4 mg total) by mouth every 6 (six) hours. (Patient not taking: Reported on 12/28/2022) 12 tablet 0   No facility-administered medications prior to visit.     Past Medical History:  Diagnosis Date   HIV (human immunodeficiency virus infection) ([place])      History reviewed. No pertinent surgical history.     Review of Systems  Constitutional:  Negative for activity change, appetite change, chills, diaphoresis,  fatigue and fever.  HENT: Negative.    Respiratory:  Negative for cough, chest tightness, shortness of breath and wheezing.   Cardiovascular:  Negative for chest pain and palpitations.  Gastrointestinal:  Positive for constipation, nausea and vomiting. Negative for abdominal pain, anal bleeding, blood in stool, diarrhea and rectal pain.  Genitourinary: Negative.   Skin: Negative.   Neurological:  Negative for dizziness, weakness, light-headedness and headaches.  Hematological:  Negative for adenopathy.  Psychiatric/Behavioral:  Negative for behavioral problems and sleep disturbance. The patient is not nervous/anxious.       Objective:    BP 118/77   Pulse (!) 53   Temp 98.3 F (36.8 C) (Oral)   Wt 167 lb 12.8 oz (76.1 kg)   SpO2 98%   BMI 26.28 kg/m  Nursing note and vital signs reviewed.  Physical Exam Vitals reviewed.  Constitutional:      General: He is not in acute distress.    Appearance: Normal appearance. He is normal weight. He is not ill-appearing, toxic-appearing or diaphoretic.  HENT:     Head: Normocephalic and atraumatic.  Eyes:     Conjunctiva/sclera: Conjunctivae normal.     Pupils: Pupils are equal, round, and reactive to light.  Cardiovascular:     Rate and Rhythm: Normal rate and regular rhythm.     Pulses: Normal pulses.     Heart sounds: Normal heart sounds. No murmur heard.    No friction rub. No gallop.  Pulmonary:     Effort: Pulmonary effort is normal. No respiratory distress.     Breath sounds: Normal breath sounds. No stridor. No wheezing, rhonchi or rales.  Chest:     Chest wall: No tenderness.  Abdominal:     General: Bowel sounds are normal. There is no distension.     Palpations: There is no mass.     Tenderness: There is no abdominal tenderness. There is no right CVA tenderness, left CVA tenderness, guarding or rebound.     Hernia: No hernia is present.  Musculoskeletal:        General: Normal range of motion.     Cervical back: Normal  range of motion and neck supple.  Skin:    General: Skin is warm and dry.  Neurological:     General: No focal deficit present.     Mental Status: He is alert and oriented to person, place, and time.  Psychiatric:        Mood and Affect: Mood normal.        Behavior: Behavior normal.        Thought Content: Thought content normal.        Judgment: Judgment normal.         12/28/2022   10:01 AM 12/15/2021    9:00 AM  Depression screen PHQ 2/9  Decreased Interest 0 0  Down, Depressed, Hopeless 0 0  PHQ - 2 Score 0 0       Assessment & Plan:  Constipation/Nausea: Has been using saline enemas twice a week on and off for 2 years, often not successful with complete bowel movement.   -Encouraged hydration at a minimum 64 oz of water daily -increase fiber intake (handout provided) -Miralax and docusate daily -Colon cleanse with gatorade and miralax on a weekend, stay close to home and start early -Abd x ray today -If symptoms change or worsen (abd tenderness, fever, intractrable vomiting or diarrhea) followu p with PCP for further evaluation  HIV-1:  -Tolerating and adherent to Biktarvy daily without any missed doses -12/28/22 VL 3,410 and CD4 377 -follow up in 3 months for ongoing care   Patient Active Problem List   Diagnosis Date Noted   Chronic idiopathic constipation 02/08/2023   HIV (human immunodeficiency virus infection) ([place]) 05/18/2020     Problem List Items Addressed This Visit       Digestive   Chronic idiopathic constipation - Primary   Relevant Orders   DG Abd 2 Views     Other   HIV (human immunodeficiency virus infection) ([place])   Relevant Orders   HIV-1 RNA quant-no reflex-bld   T-helper cell (CD4)- ([facility] only)     I have discontinued Kaziah Coakley's ondansetron and dicyclomine. I am also having him maintain his Biktarvy.   No orders of the defined types were placed in this encounter.    Follow-up: Return in about 3 months (around  05/09/2023) for HIV-1.

## 2023-02-08 NOTE — Patient Instructions (Addendum)
Increase water intake 64 oz daily Increase fiber-fiber supplement or must add loads of leafy green vegetables Prune juice Exercise regularly Miralax-colon cleanse instruction Mix the entire bottle of miralax into 64 ounces of Gatorade. Start taking Miralax solution early, drink one 8 ounce glass of solution every 30-60 minutes until solution is gone. This will take 4-8 hours to drink. If you become nauseated or feel full, stop drinking for at least 30 minutes. Then resume smaller amounts 4-6 ounces every 45-60 minutes.  Once you are cleaned out keep maintenance dose of miralax daily with docusate (stool softener)  Abdominal x ray-today If not improved you will need to follow up with PCP for further evaluation  Continue Biktarvy Follow up in 3 months for HIV-1

## 2023-02-09 LAB — T-HELPER CELL (CD4) - (RCID CLINIC ONLY)
CD4 % Helper T Cell: 31 % — ABNORMAL LOW (ref 33–65)
CD4 T Cell Abs: 446 /uL (ref 400–1790)

## 2023-02-11 LAB — HIV-1 RNA QUANT-NO REFLEX-BLD
HIV 1 RNA Quant: 25 Copies/mL — ABNORMAL HIGH
HIV-1 RNA Quant, Log: 1.4 Log cps/mL — ABNORMAL HIGH

## 2023-02-13 ENCOUNTER — Telehealth: Payer: Self-pay

## 2023-02-13 NOTE — Telephone Encounter (Signed)
-----   Message from Robert Bellow, [place] sent at 02/13/2023  8:27 AM EST ----- Please notify jarquez the abdominal x ray was normal

## 2023-02-15 ENCOUNTER — Telehealth: Payer: Self-pay

## 2023-02-15 NOTE — Telephone Encounter (Signed)
-----   Message from Robert Bellow, [place] sent at 02/15/2023  3:34 PM EST ----- Please notify Dearon his VL continues to decrease and was 25 last week, CD4 was 446. Keep up the good work.

## 2023-02-20 NOTE — Telephone Encounter (Signed)
Called patient to relay results, call was answered and disconnected.   Beryle Flock, RN

## 2023-05-09 ENCOUNTER — Ambulatory Visit: Payer: Medicaid Other | Admitting: Physician Assistant

## 2023-06-26 ENCOUNTER — Ambulatory Visit (INDEPENDENT_AMBULATORY_CARE_PROVIDER_SITE_OTHER): Payer: Medicaid Other | Admitting: Physician Assistant

## 2023-06-26 ENCOUNTER — Ambulatory Visit
Admission: RE | Admit: 2023-06-26 | Discharge: 2023-06-26 | Disposition: A | Discharge status: Home / Self Care | Payer: Medicaid Other | Source: Ambulatory Visit | Attending: Physician Assistant | Admitting: Physician Assistant

## 2023-06-26 ENCOUNTER — Encounter: Payer: Self-pay | Admitting: Physician Assistant

## 2023-06-26 ENCOUNTER — Other Ambulatory Visit: Payer: Self-pay

## 2023-06-26 VITALS — BP 119/83 | HR 52 | Temp 98.3°F | Resp 16 | Wt 166.4 lb

## 2023-06-26 DIAGNOSIS — K5904 Chronic idiopathic constipation: Secondary | ICD-10-CM

## 2023-06-26 DIAGNOSIS — Z8619 Personal history of other infectious and parasitic diseases: Secondary | ICD-10-CM

## 2023-06-26 DIAGNOSIS — B20 Human immunodeficiency virus [HIV] disease: Secondary | ICD-10-CM | POA: Diagnosis present

## 2023-06-26 DIAGNOSIS — Z21 Asymptomatic human immunodeficiency virus [HIV] infection status: Secondary | ICD-10-CM

## 2023-06-26 LAB — COMPLETE METABOLIC PANEL WITH GFR
Calcium: 9.8 mg/dL (ref 8.6–10.3)
Chloride: 104 mmol/L (ref 98–110)

## 2023-06-26 LAB — CBC WITH DIFFERENTIAL/PLATELET
MCV: 96.5 fL (ref 80.0–100.0)
RDW: 11.5 % (ref 11.0–15.0)
WBC: 3.3 10*3/uL — ABNORMAL LOW (ref 3.8–10.8)

## 2023-06-26 MED ORDER — LINACLOTIDE 145 MCG PO CAPS
145.0000 ug | ORAL_CAPSULE | Freq: Every day | ORAL | 0 refills | Status: AC
Start: 2023-06-26 — End: ?

## 2023-06-26 MED ORDER — BIKTARVY 50-200-25 MG PO TABS: 1.0000 | ORAL_TABLET | Freq: Every day | ORAL | 5 refills | Status: DC
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
## 2023-06-26 NOTE — Patient Instructions (Addendum)
Linzess  145 mcg 30 minutes before breakfast or meal daily-most likely medicaid won't cover Refilled biktarvy take daily Follow up in 4 months Abdominal x ray at [facility] today Labs today Letter for medical support dog

## 2023-06-27 LAB — T-HELPER CELL (CD4) - (RCID CLINIC ONLY)
CD4 % Helper T Cell: 32 % — ABNORMAL LOW (ref 33–65)
CD4 T Cell Abs: 339 /uL — ABNORMAL LOW (ref 400–1790)

## 2023-06-28 LAB — CBC WITH DIFFERENTIAL/PLATELET
Absolute Monocytes: 320 cells/uL (ref 200–950)
Basophils Absolute: 10 {cells}/uL (ref 0–200)
Basophils Relative: 0.3 %
Eosinophils Absolute: 40 cells/uL (ref 15–500)
Eosinophils Relative: 1.2 %
HCT: 43.9 % (ref 38.5–50.0)
Hemoglobin: 14.9 g/dL (ref 13.2–17.1)
Lymphs Abs: 1211 cells/uL (ref 850–3900)
MCH: 32.7 pg (ref 27.0–33.0)
MCHC: 33.9 g/dL (ref 32.0–36.0)
MPV: 10.6 fL (ref 7.5–12.5)
Monocytes Relative: 9.7 %
Neutro Abs: 1719 {cells}/uL (ref 1500–7800)
Neutrophils Relative %: 52.1 %
Platelets: 216 10*3/uL (ref 140–400)
RBC: 4.55 10*6/uL (ref 4.20–5.80)
Total Lymphocyte: 36.7 %

## 2023-06-28 LAB — COMPLETE METABOLIC PANEL WITH GFR
ALT: 18 U/L (ref 9–46)
AST: 24 U/L (ref 10–40)
Alkaline phosphatase (APISO): 53 U/L (ref 36–130)
BUN: 15 mg/dL (ref 7–25)
Creat: 1.12 mg/dL (ref 0.60–1.24)
Globulin: 2.9 g/dL (calc) (ref 1.9–3.7)
Glucose, Bld: 60 mg/dL — ABNORMAL LOW (ref 65–99)
Potassium: 4.3 mmol/L (ref 3.5–5.3)
Sodium: 141 mmol/L (ref 135–146)

## 2023-06-28 LAB — COMPLETE METABOLIC PANEL WITHOUT GFR
AG Ratio: 1.7 (calc) (ref 1.0–2.5)
Albumin: 4.8 g/dL (ref 3.6–5.1)
CO2: 32 mmol/L (ref 20–32)
Total Bilirubin: 0.7 mg/dL (ref 0.2–1.2)
Total Protein: 7.7 g/dL (ref 6.1–8.1)
eGFR: 96 mL/min/{1.73_m2} (ref >=60)

## 2023-06-28 LAB — RPR TITER: RPR Titer: 1:1 {titer} — ABNORMAL HIGH

## 2023-06-28 LAB — HIV-1 RNA QUANT-NO REFLEX-BLD
HIV 1 RNA Quant: 10600 Copies/mL — ABNORMAL HIGH
HIV-1 RNA Quant, Log: 4.03 Log cps/mL — ABNORMAL HIGH

## 2023-06-28 LAB — RPR: RPR Ser Ql: REACTIVE — AB

## 2023-06-28 LAB — T PALLIDUM AB: T Pallidum Abs: POSITIVE — AB

## 2023-07-03 ENCOUNTER — Telehealth: Payer: Self-pay

## 2023-07-03 NOTE — Telephone Encounter (Signed)
-----   Message from [name], [place] sent at 07/03/2023  9:50 AM EDT ----- Please notify Adrian Little his abdominal x ray revealed constipation no other radiographic concerns.  Was he able to start Linzess? Follow up with PCP as discussed.

## 2023-07-05 NOTE — Telephone Encounter (Signed)
Called [facility] to relay xray results per Arvilla Meres, PA. [facility] has not been able to get the Linzess as it's still too expensive even with [facility]. Encouraged him to follow up with PCP.  Sandie Ano, RN

## 2023-07-10 ENCOUNTER — Telehealth: Payer: Self-pay

## 2023-07-10 NOTE — Telephone Encounter (Signed)
-----   Message from Arvilla Meres sent at 07/10/2023  2:14 PM EDT ----- Jeri Modena did not view his test results. Could you please notify. Jacub your viral load is elevated at 10,600 due to being without ART 4 weeks, your virus is transmissible.  Partner/fiance if not HIV+ will need to use condoms with sexual activity or remain abstinent until VL is ND again. Always take Biktarvy as discussed if you run out please contact clinic ASAP.  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 Follow up as discussed

## 2023-07-10 NOTE — Telephone Encounter (Signed)
Called patient to relay results, no answer. Left HIPAA compliant voicemail requesting callback.   Megan D Burroughs, RN  

## 2023-10-27 ENCOUNTER — Ambulatory Visit: Payer: Medicaid Other | Admitting: Physician Assistant

## 2024-03-25 ENCOUNTER — Ambulatory Visit (INDEPENDENT_AMBULATORY_CARE_PROVIDER_SITE_OTHER): Admitting: Internal Medicine

## 2024-03-25 ENCOUNTER — Other Ambulatory Visit: Payer: Self-pay

## 2024-03-25 ENCOUNTER — Encounter: Payer: Self-pay | Admitting: Internal Medicine

## 2024-03-25 VITALS — BP 118/76 | HR 52 | Temp 98.6°F | Resp 16 | Wt 182.2 lb

## 2024-03-25 DIAGNOSIS — Z23 Encounter for immunization: Secondary | ICD-10-CM | POA: Diagnosis not present

## 2024-03-25 DIAGNOSIS — Z21 Asymptomatic human immunodeficiency virus [HIV] infection status: Secondary | ICD-10-CM

## 2024-03-25 MED ORDER — BIKTARVY 50-200-25 MG PO TABS
1.0000 | ORAL_TABLET | Freq: Every day | ORAL | 5 refills | Status: AC
Start: 2024-03-25 — End: ?

## 2024-03-25 NOTE — Assessment & Plan Note (Signed)
 Currently uncontrolled.  I had a long discussion with him regarding the importance of staying in care and staying on his medication.  He did acknowledge the importance of this and is motivated to restart and get back in and stay in care.  I discussed potential burdens and he did not identify anything that is keeping him from his medication.  Prescription sent to his selected pharmacy.  I will have him return in 4 to 5 weeks to recheck labs and how he is doing.  I have personally spent 32 minutes involved in face-to-face and non-face-to-face activities for this patient on the day of the visit. Professional time spent includes the following activities: Preparing to see the patient (review of tests), Obtaining and/or reviewing separately obtained history (admission/discharge record), Performing a medically appropriate examination and/or evaluation , Ordering medications/tests/procedures, referring and communicating with other health care professionals, Documenting clinical information in the EMR, Independently interpreting results (not separately reported), Communicating results to the patient/family/caregiver, Counseling and educating the patient/family/caregiver and Care coordination (not separately reported).

## 2024-03-25 NOTE — Progress Notes (Signed)
   Subjective:    Patient ID: Yvette Roark, male    DOB: November 22, 2001, 23 y.o.   MRN: [id number]  HPI Reyes is here for follow-up of HIV. He was last seen in July and had been off medication for several weeks and what that was reflected in his lab work.  He is here now to get back into care and fortunately has still been off of his medications.  He has had difficulty getting to the pharmacy and getting his medication.  He though is motivated to restart and get back into care.  No weight loss, no diarrhea.  No new issues.  Continues to have medical coverage.   Review of Systems  Constitutional:  Negative for fatigue.  Gastrointestinal:  Negative for diarrhea and nausea.       Objective:   Physical Exam Eyes:     General: No scleral icterus. Pulmonary:     Effort: Pulmonary effort is normal.  Neurological:     Mental Status: He is alert.   SH: + tobacco        Assessment & Plan:

## 2024-03-27 LAB — HIV-1 RNA QUANT-NO REFLEX-BLD
HIV 1 RNA Quant: 81500 {copies}/mL — ABNORMAL HIGH
HIV-1 RNA Quant, Log: 4.91 {Log_copies}/mL — ABNORMAL HIGH

## 2024-03-27 LAB — T-HELPER CELLS (CD4) COUNT (NOT AT ARMC)
Absolute CD4: 352 {cells}/uL — ABNORMAL LOW (ref 490–1740)
CD4 T Helper %: 26 % — ABNORMAL LOW (ref 30–61)
Total lymphocyte count: 1339 {cells}/uL (ref 850–3900)

## 2024-05-01 ENCOUNTER — Ambulatory Visit: Admitting: Internal Medicine

## 2024-05-06 ENCOUNTER — Telehealth: Payer: Self-pay

## 2024-05-06 NOTE — Telephone Encounter (Signed)
 Detectable Viral Load Intervention (DVL)  Most recent VL:  HIV 1 RNA Quant  Date Value Ref Range Status  03/25/2024 81,500 (H) NOT DETECTED copies/mL Final  06/26/2023 10,600 (H) Copies/mL Final  02/08/2023 25 (H) Copies/mL Final    Last Clinic Visit: 03/25/24  Current ART regimen: Biktarvy   Appointment status: patient has future appointment scheduled  Medication last dispensed (per chart review):   Medication Adherence   Left vm/ will send mychart message   Barriers to Care  Left vm will send mychart message.     Interventions   Called patient to discuss medication adherence and possible barriers to care.

## 2024-05-07 ENCOUNTER — Ambulatory Visit: Payer: Self-pay | Admitting: Internal Medicine
